# Patient Record
Sex: Female | Born: 1985 | Race: Black or African American | Hispanic: No | Marital: Single | Smoking: Current some day smoker
Health system: Southern US, Community
[De-identification: ages and names within clinical notes are randomized; demographics above are authoritative.]

## PROBLEM LIST (undated history)

## (undated) ENCOUNTER — Emergency Department (HOSPITAL_COMMUNITY): Disposition: A | Discharge status: Home / Self Care | Payer: Self-pay

## (undated) DIAGNOSIS — Z789 Other specified health status: Secondary | ICD-10-CM

## (undated) HISTORY — PX: OTHER SURGICAL HISTORY: SHX169

## (undated) HISTORY — DX: Other specified health status: Z78.9

---

## 2012-03-04 ENCOUNTER — Emergency Department: Payer: Self-pay | Admitting: Emergency Medicine

## 2012-03-04 LAB — CBC
MCHC: 32.1 g/dL (ref 32.0–36.0)
Platelet: 162 10*3/uL (ref 150–440)
RBC: 5.19 10*6/uL (ref 3.80–5.20)
RDW: 14.6 % — ABNORMAL HIGH (ref 11.5–14.5)
WBC: 8.9 10*3/uL (ref 3.6–11.0)

## 2012-03-04 LAB — COMPREHENSIVE METABOLIC PANEL
Alkaline Phosphatase: 53 U/L (ref 50–136)
Bilirubin,Total: 0.6 mg/dL (ref 0.2–1.0)
Calcium, Total: 9.1 mg/dL (ref 8.5–10.1)
EGFR (African American): >60
EGFR (Non-African Amer.): >60
SGOT(AST): 32 U/L (ref 15–37)
SGPT (ALT): 31 U/L
Total Protein: 8.2 g/dL (ref 6.4–8.2)

## 2012-03-04 LAB — URINALYSIS, COMPLETE
Leukocyte Esterase: NEGATIVE
Nitrite: NEGATIVE
Protein: 30
RBC,UR: 331 /HPF (ref 0–5)
Squamous Epithelial: 8

## 2012-03-04 LAB — PREGNANCY, URINE: Pregnancy Test, Urine: NEGATIVE m[IU]/mL

## 2013-08-02 ENCOUNTER — Emergency Department: Payer: Self-pay | Admitting: Emergency Medicine

## 2014-08-23 ENCOUNTER — Emergency Department: Payer: Self-pay | Admitting: Emergency Medicine

## 2018-06-22 ENCOUNTER — Encounter: Payer: Self-pay | Admitting: Emergency Medicine

## 2018-06-22 ENCOUNTER — Emergency Department
Admission: EM | Admit: 2018-06-22 | Discharge: 2018-06-22 | Disposition: A | Discharge status: Home / Self Care | Payer: Managed Care, Other (non HMO) | Attending: Emergency Medicine | Admitting: Emergency Medicine

## 2018-06-22 DIAGNOSIS — M542 Cervicalgia: Secondary | ICD-10-CM | POA: Diagnosis present

## 2018-06-22 DIAGNOSIS — M436 Torticollis: Secondary | ICD-10-CM

## 2018-06-22 MED ORDER — CYCLOBENZAPRINE HCL 10 MG PO TABS
10.0000 mg | ORAL_TABLET | Freq: Once | ORAL | Status: AC
  Administered 2018-06-22: 10 mg via ORAL
  Filled 2018-06-22: qty 1

## 2018-06-22 MED ORDER — CYCLOBENZAPRINE HCL 10 MG PO TABS: 10.0000 mg | ORAL_TABLET | Freq: Three times a day (TID) | ORAL | 0 refills | Status: DC | PRN

## 2018-06-22 MED ORDER — TRAMADOL HCL 50 MG PO TABS: 50.0000 mg | ORAL_TABLET | Freq: Two times a day (BID) | ORAL | 0 refills | Status: DC | PRN

## 2018-06-22 MED ORDER — IBUPROFEN 600 MG PO TABS
600.0000 mg | ORAL_TABLET | Freq: Once | ORAL | Status: AC
  Administered 2018-06-22: 600 mg via ORAL
  Filled 2018-06-22: qty 1

## 2018-06-22 MED ORDER — IBUPROFEN 600 MG PO TABS: 600.0000 mg | ORAL_TABLET | Freq: Three times a day (TID) | ORAL | 0 refills | Status: DC | PRN

## 2018-06-22 MED ORDER — TRAMADOL HCL 50 MG PO TABS
50.0000 mg | ORAL_TABLET | Freq: Once | ORAL | Status: AC
  Administered 2018-06-22: 50 mg via ORAL
  Filled 2018-06-22: qty 1

## 2018-06-22 NOTE — ED Provider Notes (Signed)
[facility] Emergency Department Provider Note   ____________________________________________   First MD Initiated Contact with Patient 06/22/18 [phone]     (approximate)  I have reviewed the triage vital signs and the nursing notes.   HISTORY  Chief Complaint Back Pain    HPI Phyllis Barker is a 32 y.o. female patient complain of neck pain radiating to left shoulder for 2 days.  Patient states she awakened with complaint 2 days ago.  Patient has similar complaint couple years ago.  Patient denies loss of sensation.  Patient has decreased range of motion with left lateral movements and also with adduction of the left shoulder.  Patient is right-hand dominant.  Patient rates her pain discomfort as 8/10.  No palliative measure for complaint.  History reviewed. No pertinent past medical history.  There are no active problems to display for this patient.   History reviewed. No pertinent surgical history.  Prior to Admission medications   Medication Sig Start Date End Date Taking? Authorizing Provider  cyclobenzaprine (FLEXERIL) 10 MG tablet Take 1 tablet (10 mg total) by mouth 3 (three) times daily as needed. 06/22/18   Joni Reining, PA-C  ibuprofen (ADVIL,MOTRIN) 600 MG tablet Take 1 tablet (600 mg total) by mouth every 8 (eight) hours as needed. 06/22/18   Joni Reining, PA-C  traMADol (ULTRAM) 50 MG tablet Take 1 tablet (50 mg total) by mouth every 12 (twelve) hours as needed. 06/22/18   Joni Reining, PA-C    Allergies Patient has no known allergies.  No family history on file.  Social History Social History   Tobacco Use  . Smoking status: Not on file  Substance Use Topics  . Alcohol use: Not on file  . Drug use: Not on file    Review of Systems Constitutional: No fever/chills Eyes: No visual changes. ENT: No sore throat. Cardiovascular: Denies chest pain. Respiratory: Denies shortness of breath. Gastrointestinal: No abdominal pain.   No nausea, no vomiting.  No diarrhea.  No constipation. Genitourinary: Negative for dysuria. Musculoskeletal: Posterior neck and left shoulder pain.. Skin: Negative for rash. Neurological: Negative for headaches, focal weakness or numbness.   ____________________________________________   PHYSICAL EXAM:  VITAL SIGNS: ED Triage Vitals  Enc Vitals Group     BP 06/22/18 0730 (!) 149/93     Pulse Rate 06/22/18 0730 77     Resp 06/22/18 0730 20     Temp 06/22/18 0730 98.1 F (36.7 C)     Temp Source 06/22/18 0730 Oral     SpO2 06/22/18 0730 100 %     Weight 06/22/18 0731 110 lb (49.9 kg)     Height 06/22/18 0731 5\' 2"  (1.575 m)     Head Circumference --      Peak Flow --      Pain Score 06/22/18 0731 8     Pain Loc --      Pain Edu? --      Excl. in GC? --     Constitutional: Alert and oriented. Well appearing and in no acute distress. Eyes: Conjunctivae are normal. PERRL. EOMI. Head: Atraumatic. Nose: No congestion/rhinnorhea. Mouth/Throat: Mucous membranes are moist.  Oropharynx non-erythematous. Neck: Decreased range of motion with lateral movements. Hematological/Lymphatic/Immunilogical: No cervical lymphadenopathy. Cardiovascular: Normal rate, regular rhythm. Grossly normal heart sounds.  Good peripheral circulation.  Elevated blood pressure Respiratory: Normal respiratory effort.  No retractions. Lungs CTAB. Gastrointestinal: Soft and nontender. No distention. No abdominal bruits. No CVA tenderness. Musculoskeletal: No  lower extremity tenderness nor edema.  No joint effusions. Neurologic:  Normal speech and language. No gross focal neurologic deficits are appreciated. No gait instability. Skin:  Skin is warm, dry and intact. No rash noted. Psychiatric: Mood and affect are normal. Speech and behavior are normal.  ____________________________________________   LABS (all labs ordered are listed, but only abnormal results are displayed)  Labs Reviewed - No data to  display ____________________________________________  EKG   ____________________________________________  RADIOLOGY  ED MD interpretation:    Official radiology report(s): No results found.  ____________________________________________   PROCEDURES  Procedure(s) performed: None  Procedures  Critical Care performed: No  ____________________________________________   INITIAL IMPRESSION / ASSESSMENT AND PLAN / ED COURSE  As part of my medical decision making, I reviewed the following data within the electronic MEDICAL RECORD NUMBER    Neck and left shoulder pain secondary to torticollis.  Patient given discharge care instruction work note.  Patient advised take medication as directed.  Patient advised not to take pain medicine and muscle relaxer while working.      ____________________________________________   FINAL CLINICAL IMPRESSION(S) / ED DIAGNOSES  Final diagnoses:  Torticollis     ED Discharge Orders        Ordered    cyclobenzaprine (FLEXERIL) 10 MG tablet  3 times daily PRN     06/22/18 0746    traMADol (ULTRAM) 50 MG tablet  Every 12 hours PRN     06/22/18 0746    ibuprofen (ADVIL,MOTRIN) 600 MG tablet  Every 8 hours PRN     06/22/18 0746       Note:  This document was prepared using Dragon voice recognition software and may include unintentional dictation errors.    Joni ReiningSmith, Yehonatan Grandison K, PA-C 06/22/18 [id number]    Merrily Brittleifenbark, Neil, MD 06/22/18 [phone]

## 2018-06-22 NOTE — ED Triage Notes (Signed)
Pt reports intermittent back spasms for a year, reports it started again yesterday morning.

## 2018-06-22 NOTE — ED Notes (Signed)
See triage note   Presents with pain and spasm to neck and upper back  States she woke up with sx's yesterday  Denies any injury

## 2020-07-17 ENCOUNTER — Other Ambulatory Visit: Payer: Self-pay

## 2020-07-17 DIAGNOSIS — M545 Low back pain: Secondary | ICD-10-CM | POA: Diagnosis not present

## 2020-07-17 DIAGNOSIS — R030 Elevated blood-pressure reading, without diagnosis of hypertension: Secondary | ICD-10-CM | POA: Diagnosis not present

## 2020-07-17 LAB — URINALYSIS, COMPLETE (UACMP) WITH MICROSCOPIC
Bacteria, UA: NONE SEEN
Bilirubin Urine: NEGATIVE
Glucose, UA: NEGATIVE mg/dL
Hgb urine dipstick: NEGATIVE
Ketones, ur: NEGATIVE mg/dL
Leukocytes,Ua: NEGATIVE
Nitrite: NEGATIVE
Protein, ur: NEGATIVE mg/dL
Specific Gravity, Urine: 1.027 (ref 1.005–1.030)
pH: 7 (ref 5.0–8.0)

## 2020-07-17 LAB — POCT PREGNANCY, URINE: Preg Test, Ur: NEGATIVE

## 2020-07-17 NOTE — ED Triage Notes (Signed)
Pt complains of lower back spasms for several days. Pt denies fever, dysuria, known hematuria.

## 2020-07-18 ENCOUNTER — Emergency Department
Admission: EM | Admit: 2020-07-18 | Discharge: 2020-07-18 | Disposition: A | Discharge status: Home / Self Care | Payer: BC Managed Care – PPO | Attending: Emergency Medicine | Admitting: Emergency Medicine

## 2020-07-18 DIAGNOSIS — R030 Elevated blood-pressure reading, without diagnosis of hypertension: Secondary | ICD-10-CM

## 2020-07-18 DIAGNOSIS — M545 Low back pain, unspecified: Secondary | ICD-10-CM

## 2020-07-18 MED ORDER — IBUPROFEN 600 MG PO TABS: 600.0000 mg | ORAL_TABLET | Freq: Three times a day (TID) | ORAL | 0 refills | Status: DC | PRN

## 2020-07-18 MED ORDER — METHOCARBAMOL 500 MG PO TABS: 500.0000 mg | ORAL_TABLET | Freq: Four times a day (QID) | ORAL | 0 refills | Status: DC

## 2020-07-18 MED ORDER — KETOROLAC TROMETHAMINE 30 MG/ML IJ SOLN
30.0000 mg | Freq: Once | INTRAMUSCULAR | Status: AC
  Administered 2020-07-18: 30 mg via INTRAMUSCULAR
  Filled 2020-07-18: qty 1

## 2020-07-18 NOTE — Discharge Instructions (Signed)
Follow-up with your primary care provider at [facility] if any continued problems with your back.  Begin taking medication as directed that was sent to your pharmacy which is an anti-inflammatory and muscle relaxant.  You may use ice or heat to your back as needed for discomfort.  Do not take the muscle relaxant if you plan on driving or operating machinery as it could cause drowsiness.  Urinalysis today was negative for infection.  Also see your primary care provider to follow-up on your elevated blood pressure while in the emergency department today.

## 2020-07-18 NOTE — ED Provider Notes (Signed)
[facility] Emergency Department Provider Note   ____________________________________________   First MD Initiated Contact with Patient 07/18/20 [phone]     (approximate)  I have reviewed the triage vital signs and the nursing notes.   HISTORY  Chief Complaint Back Pain   HPI Phyllis Barker is a 34 y.o. female presents to the ED with complaint of low back pain for several days.  Patient states that she has been lifting and began having some tightness in her lower back.  She denies any urinary symptoms and has had problems with her back in the past.  Patient continues to ambulate without any assistance.  She rates her pain as an 8 out of 10.       No past medical history on file.  There are no problems to display for this patient.   No past surgical history on file.  Prior to Admission medications   Medication Sig Start Date End Date Taking? Authorizing Provider  ibuprofen (ADVIL) 600 MG tablet Take 1 tablet (600 mg total) by mouth every 8 (eight) hours as needed. 07/18/20   Tommi Rumps, PA-C  methocarbamol (ROBAXIN) 500 MG tablet Take 1 tablet (500 mg total) by mouth 4 (four) times daily. 07/18/20   Tommi Rumps, PA-C    Allergies Patient has no known allergies.  No family history on file.  Social History Social History   Tobacco Use  . Smoking status: Not on file  Substance Use Topics  . Alcohol use: Not on file  . Drug use: Not on file    Review of Systems Constitutional: No fever/chills Eyes: No visual changes. Cardiovascular: Denies chest pain. Respiratory: Denies shortness of breath. Gastrointestinal: No abdominal pain.  No nausea, no vomiting.   Genitourinary: Negative for dysuria. Musculoskeletal: Positive for low back pain. Skin: Negative for rash. Neurological: Negative for headaches, focal weakness or numbness. ____________________________________________   PHYSICAL EXAM:  VITAL SIGNS: ED Triage Vitals  Enc  Vitals Group     BP 07/17/20 2114 (!) 152/103     Pulse Rate 07/17/20 2114 77     Resp 07/17/20 2114 16     Temp 07/17/20 2114 98.3 F (36.8 C)     Temp Source 07/17/20 2114 Oral     SpO2 07/17/20 2114 100 %     Weight 07/17/20 2115 110 lb (49.9 kg)     Height 07/17/20 2115 5\' 2"  (1.575 m)     Head Circumference --      Peak Flow --      Pain Score 07/17/20 2115 8     Pain Loc --      Pain Edu? --      Excl. in GC? --     Constitutional: Alert and oriented. Well appearing and in no acute distress. Eyes: Conjunctivae are normal.  Head: Atraumatic. Neck: No stridor.   Cardiovascular: Normal rate, regular rhythm. Grossly normal heart sounds.  Good peripheral circulation. Respiratory: Normal respiratory effort.  No retractions. Lungs CTAB. Gastrointestinal: Soft and nontender. No distention.  Musculoskeletal: On examination of the lower back there is no gross deformity however patient does have restriction with range of motion in all planes secondary to her discomfort.  Straight leg raises are negative.  No point tenderness or step-offs are noted on palpation of the lumbar spine.  Patient is able move all extremities and is weightbearing with normal gait. Neurologic:  Normal speech and language.  Flexors are 2+ bilaterally.  No gross focal neurologic  deficits are appreciated.  Skin:  Skin is warm, dry and intact. No rash noted. Psychiatric: Mood and affect are normal. Speech and behavior are normal.  ____________________________________________   LABS (all labs ordered are listed, but only abnormal results are displayed)  Labs Reviewed  URINALYSIS, COMPLETE (UACMP) WITH MICROSCOPIC - Abnormal; Notable for the following components:      Result Value   Color, Urine YELLOW (*)    APPearance HAZY (*)    All other components within normal limits  POC URINE PREG, ED  POCT PREGNANCY, URINE    PROCEDURES  Procedure(s) performed (including Critical  Care):  Procedures   ____________________________________________   INITIAL IMPRESSION / ASSESSMENT AND PLAN / ED COURSE  As part of my medical decision making, I reviewed the following data within the electronic MEDICAL RECORD NUMBER Notes from prior ED visits and [place] Controlled Substance Database  Phyllis Barker was evaluated in Emergency Department on 07/18/2020 for the symptoms described in the history of present illness. She was evaluated in the context of the global COVID-19 pandemic, which necessitated consideration that the patient might be at risk for infection with the SARS-CoV-2 virus that causes COVID-19. Institutional protocols and algorithms that pertain to the evaluation of patients at risk for COVID-19 are in a state of rapid change based on information released by regulatory bodies including the CDC and federal and state organizations. These policies and algorithms were followed during the patient's care in the ED.  34 year old female presents to the ED with complaint of low back pain for several days without history of direct trauma.  Patient states she has been lifting objects prior to her back pain.  Patient has not taken any over-the-counter medication.  An injection of Toradol was given while in the ED.  Physical exam is positive for paravertebral lower lumbar muscle skeletal pain.  Neurologically she is intact and normal gait was noted.  Patient is to follow-up with her PCP if any continued problems.  She is encouraged to use ice or heat to her back as needed for discomfort.  She was discharged with a prescription for ibuprofen and methocarbamol and a work note.   ____________________________________________   FINAL CLINICAL IMPRESSION(S) / ED DIAGNOSES  Final diagnoses:  Acute bilateral low back pain without sciatica  Elevated blood pressure reading     ED Discharge Orders         Ordered    ibuprofen (ADVIL) 600 MG tablet  Every 8 hours PRN,   Status:  Discontinued         07/18/20 1002    methocarbamol (ROBAXIN) 500 MG tablet  4 times daily        07/18/20 1002    ibuprofen (ADVIL) 600 MG tablet  Every 8 hours PRN        07/18/20 1002           Note:  This document was prepared using Dragon voice recognition software and may include unintentional dictation errors.    Tommi Rumps, PA-C 07/18/20 1134    Sharyn Creamer, MD 07/18/20 1535

## 2020-07-18 NOTE — ED Triage Notes (Signed)
Pt called from [facility] to treatment room, no response 

## 2020-09-13 ENCOUNTER — Encounter: Payer: Self-pay | Admitting: Physician Assistant

## 2020-09-13 ENCOUNTER — Ambulatory Visit (LOCAL_COMMUNITY_HEALTH_CENTER): Payer: BC Managed Care – PPO | Admitting: Physician Assistant

## 2020-09-13 ENCOUNTER — Other Ambulatory Visit: Payer: Self-pay

## 2020-09-13 VITALS — BP 122/80 | Ht 62.5 in | Wt 109.8 lb

## 2020-09-13 DIAGNOSIS — Z3009 Encounter for other general counseling and advice on contraception: Secondary | ICD-10-CM

## 2020-09-13 DIAGNOSIS — Z30018 Encounter for initial prescription of other contraceptives: Secondary | ICD-10-CM | POA: Diagnosis not present

## 2020-09-13 DIAGNOSIS — Z01419 Encounter for gynecological examination (general) (routine) without abnormal findings: Secondary | ICD-10-CM

## 2020-09-13 DIAGNOSIS — Z113 Encounter for screening for infections with a predominantly sexual mode of transmission: Secondary | ICD-10-CM

## 2020-09-13 LAB — WET PREP FOR TRICH, YEAST, CLUE
Trichomonas Exam: NEGATIVE
Yeast Exam: NEGATIVE

## 2020-09-13 NOTE — Progress Notes (Signed)
Pt to clinic for physical and STD checks. Pt is not interested in any form of birth control, has female partners only. Dental dams accepted and provided to pt.

## 2020-09-13 NOTE — Progress Notes (Signed)
[facility] DEPARTMENT [facility] [address] Main Number: [phone]    Family Planning Visit- Initial Visit  Subjective:  Phyllis Barker is a 34 y.o.  [id number]   being seen today for an initial well woman visit and to discuss family planning options.  She is currently using None for pregnancy prevention. Patient reports she does not want a pregnancy in the next year.  Patient has the following medical conditions does not have a problem list on file.  Chief Complaint  Patient presents with  . Contraception    annual PE    Patient reports that she has not had a physical in a while and would like to have the physical and all available testing done today.  States that she does not have chronic conditions or take medicines regularly.  Reports that her last pap was over 3 years ago at [facility].  Is due for CBE and pap today.  Patient denies any concerns today.   Body mass index is 19.76 kg/m. - Patient is eligible for diabetes screening based on BMI and age >51?  not applicable HA1C ordered? not applicable  Patient reports 2 partners in last year. Desires STI screening?  Yes  Has patient been screened once for HCV in the past?  No  No results found for: HCVAB  Does the patient have current drug use (including MJ), have a partner with drug use, and/or has been incarcerated since last result? No  If yes-- Screen for HCV through [facility]   Does the patient meet criteria for HBV testing? No  Criteria:  -Household, sexual or needle sharing contact with HBV -History of drug use -HIV positive -Those with known Hep C   Health Maintenance Due  Topic Date Due  . Hepatitis C Screening  Never done  . COVID-19 Vaccine (1) Never done  . HIV Screening  Never done  . TETANUS/TDAP  Never done  . PAP SMEAR-Modifier  Never done  . INFLUENZA VACCINE  Never done    Review of Systems  All other systems reviewed and are negative.   The  following portions of the patient's history were reviewed and updated as appropriate: allergies, current medications, past family history, past medical history, past social history, past surgical history and problem list. Problem list updated.   See flowsheet for other program required questions.  Objective:   Vitals:   09/13/20 1004 09/13/20 1021  BP: (!) 144/99 (!) 136/95  Weight: 109 lb 12.8 oz (49.8 kg)   Height: 5' 2.5" (1.588 m)     Physical Exam Vitals and nursing note reviewed.  Constitutional:      General: She is not in acute distress.    Appearance: Normal appearance.  HENT:     Head: Normocephalic and atraumatic.     Mouth/Throat:     Mouth: Mucous membranes are moist.     Pharynx: Oropharynx is clear. No oropharyngeal exudate or posterior oropharyngeal erythema.  Eyes:     Conjunctiva/sclera: Conjunctivae normal.  Neck:     Thyroid: No thyroid mass, thyromegaly or thyroid tenderness.  Cardiovascular:     Rate and Rhythm: Normal rate and regular rhythm.  Pulmonary:     Effort: Pulmonary effort is normal.     Breath sounds: Normal breath sounds.  Chest:     Breasts:        Right: Normal. No mass, nipple discharge, skin change or tenderness.  Left: Normal. No mass, nipple discharge, skin change or tenderness.  Abdominal:     Palpations: Abdomen is soft. There is no mass.     Tenderness: There is no abdominal tenderness. There is no guarding or rebound.  Genitourinary:    General: Normal vulva.     Rectum: Normal.     Comments: External genitalia/pubic area without nits, lice, edema, erythema, lesions and inguinal adenopathy. Vagina with normal mucosa and discharge. Cervix without visible lesions. Uterus firm, mobile, nt, no masses, no CMT, no adnexal tenderness or fullness. Musculoskeletal:     Cervical back: Neck supple. No tenderness.  Lymphadenopathy:     Cervical: No cervical adenopathy.     Upper Body:     Right upper body: No supraclavicular,  axillary or pectoral adenopathy.     Left upper body: No supraclavicular, axillary or pectoral adenopathy.  Skin:    General: Skin is warm and dry.     Findings: No bruising, erythema, lesion or rash.  Neurological:     Mental Status: She is alert and oriented to person, place, and time.  Psychiatric:        Mood and Affect: Mood normal.        Behavior: Behavior normal.        Thought Content: Thought content normal.        Judgment: Judgment normal.       Assessment and Plan:  Phyllis Barker is a 34 y.o. female presenting to the [facility] Department for an initial well woman exam/family planning visit  Contraception counseling: Reviewed all forms of birth control options in the tiered based approach. available including abstinence; over the counter/barrier methods; hormonal contraceptive medication including pill, patch, ring, injection,contraceptive implant, ECP; hormonal and nonhormonal IUDs; permanent sterilization options including vasectomy and the various tubal sterilization modalities. Risks, benefits, and typical effectiveness rates were reviewed.  Questions were answered.  Written information was also given to the patient to review.  Patient desires to use condoms/dental dams, this was prescribed for patient. She will follow up in  1 year and prn for surveillance.  She was told to call with any further questions, or with any concerns about this method of contraception.  Emphasized use of condoms 100% of the time for STI prevention.  Patient was not a candidate for ECP today.  1. Encounter for counseling regarding contraception Reviewed barrier method use for STD protection. RTC if changes mind about hormonal options for period control.  2. Screening for STD (sexually transmitted disease) Await test results.  Counseled that RN will call if needs to RTC for treatment once results are back.  - WET PREP FOR TRICH, YEAST, CLUE - Chlamydia/Gonorrhea [facility] -  HIV/HCV [facility] - Syphilis [facility], [facility] - Chlamydia/Gonorrhea [facility]  3. Well woman exam with routine gynecological exam Reviewed with patient healthy habits to maintain general health. Enc MVI 1 po daily. Enc to establish with/ follow up with PCP for primary care concerns, age appropriate screenings and illness. Await results of pap.  Counseled that RN will call or send letter once results are back.  - IGP, Aptima HPV  4. Evaluation for contraception barrier or spermicide Enc to use barrier methods always.  RN gave patient dental dams.     Return in about 1 year (around 09/13/2021) for RP and prn.  No future appointments.  Matt Holmes, PA

## 2020-09-18 LAB — IGP, APTIMA HPV
HPV Aptima: NEGATIVE
PAP Smear Comment: 0

## 2021-01-27 ENCOUNTER — Encounter: Payer: Self-pay | Admitting: *Deleted

## 2021-01-27 ENCOUNTER — Other Ambulatory Visit: Payer: Self-pay

## 2021-01-27 ENCOUNTER — Emergency Department
Admission: EM | Admit: 2021-01-27 | Discharge: 2021-01-27 | Disposition: A | Discharge status: Home / Self Care | Payer: PRIVATE HEALTH INSURANCE | Attending: Emergency Medicine | Admitting: Emergency Medicine

## 2021-01-27 DIAGNOSIS — F17200 Nicotine dependence, unspecified, uncomplicated: Secondary | ICD-10-CM | POA: Insufficient documentation

## 2021-01-27 DIAGNOSIS — S6992XA Unspecified injury of left wrist, hand and finger(s), initial encounter: Secondary | ICD-10-CM | POA: Insufficient documentation

## 2021-01-27 DIAGNOSIS — W503XXA Accidental bite by another person, initial encounter: Secondary | ICD-10-CM

## 2021-01-27 DIAGNOSIS — Z23 Encounter for immunization: Secondary | ICD-10-CM | POA: Insufficient documentation

## 2021-01-27 MED ORDER — AMOXICILLIN-POT CLAVULANATE 875-125 MG PO TABS: 1.0000 | ORAL_TABLET | Freq: Two times a day (BID) | ORAL | 0 refills | Status: AC

## 2021-01-27 MED ORDER — TETANUS-DIPHTH-ACELL PERTUSSIS 5-2.5-18.5 LF-MCG/0.5 IM SUSY
0.5000 mL | PREFILLED_SYRINGE | Freq: Once | INTRAMUSCULAR | Status: AC
  Administered 2021-01-27: 0.5 mL via INTRAMUSCULAR
  Filled 2021-01-27: qty 0.5
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
## 2021-01-27 NOTE — ED Notes (Signed)
Pt out to triage area, cussing, telling staff to go "fuck yourself". Pt states 'I've been waiting, what the fuck". Pt informed that many people are waiting to be seen and several have been waiting for 2-3 hours. Pt continues to cuss, pt instructed to please remain in waiting area assigned to her so staff can find her when treatment room is ready.

## 2021-01-27 NOTE — Discharge Instructions (Signed)
Please seek medical attention for any high fevers, chest pain, shortness of breath, change in behavior, persistent vomiting, bloody stool or any other new or concerning symptoms.  
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
## 2021-02-07 ENCOUNTER — Emergency Department
Admission: EM | Admit: 2021-02-07 | Discharge: 2021-02-07 | Disposition: A | Discharge status: Home / Self Care | Payer: Medicaid Other | Attending: Emergency Medicine | Admitting: Emergency Medicine

## 2021-02-07 ENCOUNTER — Other Ambulatory Visit: Payer: Self-pay

## 2021-02-07 ENCOUNTER — Emergency Department: Payer: Medicaid Other

## 2021-02-07 DIAGNOSIS — G44309 Post-traumatic headache, unspecified, not intractable: Secondary | ICD-10-CM | POA: Insufficient documentation

## 2021-02-07 DIAGNOSIS — F17200 Nicotine dependence, unspecified, uncomplicated: Secondary | ICD-10-CM | POA: Insufficient documentation

## 2021-02-07 DIAGNOSIS — S1093XA Contusion of unspecified part of neck, initial encounter: Secondary | ICD-10-CM | POA: Insufficient documentation

## 2021-02-07 DIAGNOSIS — G44311 Acute post-traumatic headache, intractable: Secondary | ICD-10-CM

## 2021-02-07 LAB — POC URINE PREG, ED: Preg Test, Ur: NEGATIVE

## 2021-02-07 MED ORDER — MELOXICAM 15 MG PO TABS: 15.0000 mg | ORAL_TABLET | Freq: Every day | ORAL | 0 refills | Status: DC

## 2021-02-07 MED ORDER — METHOCARBAMOL 500 MG PO TABS: 500.0000 mg | ORAL_TABLET | Freq: Four times a day (QID) | ORAL | 0 refills | Status: DC
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
## 2021-07-07 ENCOUNTER — Encounter (HOSPITAL_COMMUNITY): Payer: Self-pay | Admitting: Emergency Medicine

## 2021-07-07 ENCOUNTER — Emergency Department (HOSPITAL_COMMUNITY)
Admission: EM | Admit: 2021-07-07 | Discharge: 2021-07-07 | Disposition: A | Discharge status: Home / Self Care | Payer: Medicaid Other | Attending: Emergency Medicine | Admitting: Emergency Medicine

## 2021-07-07 DIAGNOSIS — K029 Dental caries, unspecified: Secondary | ICD-10-CM | POA: Insufficient documentation

## 2021-07-07 DIAGNOSIS — F17200 Nicotine dependence, unspecified, uncomplicated: Secondary | ICD-10-CM | POA: Insufficient documentation

## 2021-07-07 MED ORDER — HYDROCODONE-ACETAMINOPHEN 5-325 MG PO TABS
1.0000 | ORAL_TABLET | Freq: Once | ORAL | Status: AC
  Administered 2021-07-07: 1 via ORAL
  Filled 2021-07-07: qty 1

## 2021-07-07 MED ORDER — IBUPROFEN 600 MG PO TABS: 600.0000 mg | ORAL_TABLET | Freq: Four times a day (QID) | ORAL | 0 refills | Status: DC | PRN

## 2021-07-07 MED ORDER — AMOXICILLIN 500 MG PO CAPS: 500.0000 mg | ORAL_CAPSULE | Freq: Three times a day (TID) | ORAL | 0 refills | Status: DC

## 2021-07-07 MED ORDER — HYDROCODONE-ACETAMINOPHEN 5-325 MG PO TABS: 1.0000 | ORAL_TABLET | ORAL | 0 refills | Status: DC | PRN

## 2021-07-07 MED ORDER — AMOXICILLIN 500 MG PO CAPS
500.0000 mg | ORAL_CAPSULE | Freq: Once | ORAL | Status: AC
Start: 2021-07-07 — End: 2021-07-07
  Administered 2021-07-07: 500 mg via ORAL
  Filled 2021-07-07: qty 1

## 2021-07-07 MED ORDER — IBUPROFEN 400 MG PO TABS
600.0000 mg | ORAL_TABLET | Freq: Once | ORAL | Status: AC
  Administered 2021-07-07: 600 mg via ORAL
  Filled 2021-07-07: qty 1

## 2021-07-07 NOTE — ED Triage Notes (Signed)
C/o L upper dental pain with swelling since Wednesday.  Denies fever and chills.

## 2021-07-07 NOTE — ED Provider Notes (Signed)
[facility] EMERGENCY DEPARTMENT Provider Note   CSN: [id number] Arrival date & time: 07/07/21  9379     History No chief complaint on file.   Phyllis Barker is a 35 y.o. female.  Pt presents to the ED today with dental pain.  The pt has had left upper pain since Wed., 8/17.  She does not have a dentist and has not seen a dentist in a long time.        Past Medical History:  Diagnosis Date   Patient denies medical problems     There are no problems to display for this patient.   Past Surgical History:  Procedure Laterality Date   denies       OB History     Gravida  0   Para  0   Term  0   Preterm  0   AB  0   Living  0      SAB  0   IAB  0   Ectopic  0   Multiple  0   Live Births  0           Family History  Problem Relation Age of Onset   Heart murmur Paternal Grandmother    Stroke Mother    Hypertension Half-Sister     Social History   Tobacco Use   Smoking status: Some Days   Smokeless tobacco: Never  Vaping Use   Vaping Use: Never used  Substance Use Topics   Alcohol use: Yes    Comment: mainly on weekend, sometimes during week   Drug use: Not Currently    Types: Marijuana    Comment: last use was "years ago"    Home Medications Prior to Admission medications   Medication Sig Start Date End Date Taking? Authorizing Provider  amoxicillin (AMOXIL) 500 MG capsule Take 1 capsule (500 mg total) by mouth 3 (three) times daily. 07/07/21  Yes Jacalyn Lefevre, MD  HYDROcodone-acetaminophen (NORCO/VICODIN) 5-325 MG tablet Take 1 tablet by mouth every 4 (four) hours as needed. 07/07/21  Yes Jacalyn Lefevre, MD  ibuprofen (ADVIL) 600 MG tablet Take 1 tablet (600 mg total) by mouth every 6 (six) hours as needed. 07/07/21  Yes Jacalyn Lefevre, MD  meloxicam (MOBIC) 15 MG tablet Take 1 tablet (15 mg total) by mouth daily. 02/07/21   Cuthriell, Delorise Royals, PA-C  methocarbamol (ROBAXIN) 500 MG tablet Take 1 tablet (500 mg  total) by mouth 4 (four) times daily. 02/07/21   Cuthriell, Delorise Royals, PA-C    Allergies    Patient has no known allergies.  Review of Systems   Review of Systems  HENT:  Positive for dental problem.   All other systems reviewed and are negative.  Physical Exam Updated Vital Signs BP 131/79 (BP Location: Left Arm)   Pulse 66   Temp 98.2 F (36.8 C)   Resp 14   SpO2 100%   Physical Exam Vitals and nursing note reviewed.  Constitutional:      Appearance: Normal appearance.  HENT:     Head: Normocephalic and atraumatic.     Right Ear: External ear normal.     Left Ear: External ear normal.     Nose: Nose normal.     Mouth/Throat:      Comments: No abscess to drain.  Mild swelling to left face. Eyes:     Extraocular Movements: Extraocular movements intact.     Conjunctiva/sclera: Conjunctivae normal.     Pupils: Pupils  are equal, round, and reactive to light.  Cardiovascular:     Rate and Rhythm: Normal rate and regular rhythm.     Pulses: Normal pulses.     Heart sounds: Normal heart sounds.  Pulmonary:     Effort: Pulmonary effort is normal.     Breath sounds: Normal breath sounds.  Abdominal:     General: Abdomen is flat. Bowel sounds are normal.     Palpations: Abdomen is soft.  Musculoskeletal:        General: Normal range of motion.     Cervical back: Normal range of motion and neck supple.  Skin:    General: Skin is warm.     Capillary Refill: Capillary refill takes less than 2 seconds.  Neurological:     General: No focal deficit present.     Mental Status: She is alert and oriented to person, place, and time.    ED Results / Procedures / Treatments   Labs (all labs ordered are listed, but only abnormal results are displayed) Labs Reviewed - No data to display  EKG None  Radiology No results found.  Procedures Procedures   Medications Ordered in ED Medications  amoxicillin (AMOXIL) capsule 500 mg (has no administration in time range)   ibuprofen (ADVIL) tablet 600 mg (has no administration in time range)  HYDROcodone-acetaminophen (NORCO/VICODIN) 5-325 MG per tablet 1 tablet (has no administration in time range)    ED Course  I have reviewed the triage vital signs and the nursing notes.  Pertinent labs & imaging results that were available during my care of the patient were reviewed by me and considered in my medical decision making (see chart for details).    MDM Rules/Calculators/A&P                           Pt will be started on amox/ibuprofen and a short course of lortab.  Return if worse.  F/u with the dental clinic. Final Clinical Impression(s) / ED Diagnoses Final diagnoses:  Dental caries    Rx / DC Orders ED Discharge Orders          Ordered    amoxicillin (AMOXIL) 500 MG capsule  3 times daily        07/07/21 0845    ibuprofen (ADVIL) 600 MG tablet  Every 6 hours PRN        07/07/21 0845    HYDROcodone-acetaminophen (NORCO/VICODIN) 5-325 MG tablet  Every 4 hours PRN        07/07/21 0845             Jacalyn Lefevre, MD 07/07/21 [phone]

## 2021-07-07 NOTE — ED Notes (Signed)
Reviewed discharge instructions with patient and family. Follow-up care and medications reviewed. Patient and family verbalized understanding. Patient A&Ox4, VSS, and ambulatory with steady gait upon discharge.  

## 2021-10-01 ENCOUNTER — Emergency Department (HOSPITAL_COMMUNITY)
Admission: EM | Admit: 2021-10-01 | Discharge: 2021-10-01 | Disposition: A | Discharge status: Home / Self Care | Payer: BC Managed Care – PPO | Attending: Emergency Medicine | Admitting: Emergency Medicine

## 2021-10-01 ENCOUNTER — Encounter (HOSPITAL_COMMUNITY): Payer: Self-pay

## 2021-10-01 DIAGNOSIS — Z20822 Contact with and (suspected) exposure to covid-19: Secondary | ICD-10-CM | POA: Insufficient documentation

## 2021-10-01 DIAGNOSIS — R6889 Other general symptoms and signs: Secondary | ICD-10-CM

## 2021-10-01 DIAGNOSIS — R519 Headache, unspecified: Secondary | ICD-10-CM | POA: Diagnosis present

## 2021-10-01 DIAGNOSIS — F17200 Nicotine dependence, unspecified, uncomplicated: Secondary | ICD-10-CM | POA: Diagnosis not present

## 2021-10-01 DIAGNOSIS — J101 Influenza due to other identified influenza virus with other respiratory manifestations: Secondary | ICD-10-CM | POA: Insufficient documentation

## 2021-10-01 DIAGNOSIS — R197 Diarrhea, unspecified: Secondary | ICD-10-CM | POA: Diagnosis not present

## 2021-10-01 LAB — RESP PANEL BY RT-PCR (FLU A&B, COVID) ARPGX2
Influenza A by PCR: POSITIVE — AB
Influenza B by PCR: NEGATIVE
SARS Coronavirus 2 by RT PCR: NEGATIVE

## 2021-10-01 MED ORDER — ACETAMINOPHEN 325 MG PO TABS
650.0000 mg | ORAL_TABLET | Freq: Once | ORAL | Status: AC
  Administered 2021-10-01: 650 mg via ORAL
  Filled 2021-10-01: qty 2

## 2021-10-01 NOTE — Discharge Instructions (Signed)
You should look out for your COVID and flu results in your chart or expect a phone call if these are positive.  Continue with over-the-counter treatment.  Attached is a work note.  I hope that you feel better.
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
## 2021-10-01 NOTE — ED Triage Notes (Signed)
Pt presents with c/o headache and chills for several days.

## 2022-05-28 ENCOUNTER — Encounter (HOSPITAL_COMMUNITY): Payer: Self-pay

## 2022-05-28 ENCOUNTER — Emergency Department (HOSPITAL_COMMUNITY)
Admission: EM | Admit: 2022-05-28 | Discharge: 2022-05-28 | Disposition: A | Discharge status: Home / Self Care | Payer: Self-pay | Attending: Emergency Medicine | Admitting: Emergency Medicine

## 2022-05-28 ENCOUNTER — Other Ambulatory Visit: Payer: Self-pay

## 2022-05-28 ENCOUNTER — Emergency Department (HOSPITAL_COMMUNITY): Payer: Self-pay

## 2022-05-28 DIAGNOSIS — M25512 Pain in left shoulder: Secondary | ICD-10-CM | POA: Diagnosis present

## 2022-05-28 DIAGNOSIS — M7918 Myalgia, other site: Secondary | ICD-10-CM | POA: Diagnosis not present

## 2022-05-28 DIAGNOSIS — M542 Cervicalgia: Secondary | ICD-10-CM | POA: Diagnosis not present

## 2022-05-28 DIAGNOSIS — Y92410 Unspecified street and highway as the place of occurrence of the external cause: Secondary | ICD-10-CM | POA: Diagnosis not present

## 2022-05-28 MED ORDER — METHOCARBAMOL 500 MG PO TABS: 1000.0000 mg | ORAL_TABLET | Freq: Four times a day (QID) | ORAL | 0 refills | Status: AC

## 2022-05-28 MED ORDER — METHOCARBAMOL 500 MG PO TABS
1000.0000 mg | ORAL_TABLET | Freq: Once | ORAL | Status: AC
  Administered 2022-05-28: 1000 mg via ORAL
  Filled 2022-05-28: qty 2
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
## 2022-05-28 NOTE — ED Notes (Signed)
I provided reinforced discharge education based off of discharge instructions. Pt acknowledged and understood my education. Pt had no further questions/concerns for provider/myself.  °
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
